# Patient Record
Sex: Female | Born: 1985 | Race: White | Hispanic: No | Marital: Single | Smoking: Former smoker
Health system: Southern US, Community
[De-identification: ages and names within clinical notes are randomized; demographics above are authoritative.]

## PROBLEM LIST (undated history)

## (undated) DIAGNOSIS — J189 Pneumonia, unspecified organism: Secondary | ICD-10-CM

## (undated) HISTORY — PX: HERNIA REPAIR: SHX51

## (undated) HISTORY — PX: IUD REMOVAL: SHX5392

---

## 2018-08-23 ENCOUNTER — Encounter: Payer: Self-pay | Admitting: Emergency Medicine

## 2018-08-23 ENCOUNTER — Other Ambulatory Visit: Payer: Self-pay

## 2018-08-23 ENCOUNTER — Emergency Department (INDEPENDENT_AMBULATORY_CARE_PROVIDER_SITE_OTHER)
Admission: EM | Admit: 2018-08-23 | Discharge: 2018-08-23 | Disposition: A | Discharge status: Home / Self Care | Payer: Medicaid Other | Source: Home / Self Care

## 2018-08-23 DIAGNOSIS — N3001 Acute cystitis with hematuria: Secondary | ICD-10-CM

## 2018-08-23 LAB — POCT URINALYSIS DIP (MANUAL ENTRY)
Bilirubin, UA: NEGATIVE
Glucose, UA: NEGATIVE mg/dL
Nitrite, UA: NEGATIVE
Protein Ur, POC: 30 mg/dL — AB
Spec Grav, UA: >=1.03 — AB (ref 1.010–1.025)
Urobilinogen, UA: 0.2 E.U./dL
pH, UA: 6.5 (ref 5.0–8.0)

## 2018-08-23 LAB — POCT URINE PREGNANCY: Preg Test, Ur: NEGATIVE

## 2018-08-23 MED ORDER — CEPHALEXIN 500 MG PO CAPS: 500.0000 mg | ORAL_CAPSULE | Freq: Two times a day (BID) | ORAL | 0 refills | Status: DC

## 2018-08-23 NOTE — ED Triage Notes (Signed)
Dysuria x 2 days and pregnancy test

## 2018-08-23 NOTE — Discharge Instructions (Signed)
°  Please take your antibiotic as prescribed. A urine culture has been sent to check the severity of your urinary infection and to determine if you are on the most appropriate antibiotic. The results should come back within 2-3 days and you will be notified even if no medication change is needed. ° °Please stay well hydrated and follow up with your family doctor in 1 week if not improving, sooner if worsening. ° °You may try over the counter medication called Azo to help with bladder spasms.  This medication can make your urine orange, which is normal.  ° °

## 2018-08-23 NOTE — ED Provider Notes (Signed)
[facility]    CSN: [id number] Arrival date & time: 08/23/18  1534     History   Chief Complaint Chief Complaint  Patient presents with  . Dysuria    HPI Ellen Romero is a 33 y.o. female.   HPI  Ellen Romero is a 33 y.o. female presenting to UC with c/o 2 days of worsening pain with urination and decreased urination when she goes.  Mild bladder pressure, and Left lower back pain.  Denies fever, chills, n/v/d. Pt reports having her IUD removed recently and being more sexually active recently. LMP 07/31/2018. Pt requesting a pregnancy test. Denies concern for STIs.   History reviewed. No pertinent past medical history.  There are no active problems to display for this patient.   History reviewed. No pertinent surgical history.  OB History   No obstetric history on file.      Home Medications    Prior to Admission medications   Medication Sig Start Date End Date Taking? Authorizing Provider  fexofenadine (ALLEGRA) 180 MG tablet Take 180 mg by mouth daily.   Yes [provider]  cephALEXin (KEFLEX) 500 MG capsule Take 1 capsule (500 mg total) by mouth 2 (two) times daily. 08/23/18   Lurene ShadowPhelps, Izayah Miner O, PA-C    Family History Family History  Problem Relation Age of Onset  . Hypertension Mother   . Atrial fibrillation Father     Social History Social History   Tobacco Use  . Smoking status: Never Smoker  . Smokeless tobacco: Never Used  Substance Use Topics  . Alcohol use: Yes  . Drug use: Not Currently     Allergies   Amoxicillin   Review of Systems Review of Systems  Constitutional: Negative for chills and fever.  Gastrointestinal: Positive for abdominal pain. Negative for diarrhea, nausea and vomiting.  Genitourinary: Positive for decreased urine volume, dysuria, frequency and urgency. Negative for flank pain.  Musculoskeletal: Positive for back pain. Negative for myalgias.     Physical Exam Triage Vital Signs ED Triage  Vitals  Enc Vitals Group     BP 08/23/18 1604 116/78     Pulse Rate 08/23/18 1604 75     Resp --      Temp 08/23/18 1604 98.4 F (36.9 C)     Temp Source 08/23/18 1604 Oral     SpO2 08/23/18 1604 98 %     Weight 08/23/18 1605 153 lb (69.4 kg)     Height 08/23/18 1605 5\' 4"  (1.626 m)     Head Circumference --      Peak Flow --      Pain Score 08/23/18 1605 4     Pain Loc --      Pain Edu? --      Excl. in [place]? --    No data found.  Updated Vital Signs BP 116/78 (BP Location: Right Arm)   Pulse 75   Temp 98.4 F (36.9 C) (Oral)   Ht 5\' 4"  (1.626 m)   Wt 153 lb (69.4 kg)   LMP 07/31/2018 (Approximate)   SpO2 98%   BMI 26.26 kg/m   Visual Acuity Right Eye Distance:   Left Eye Distance:   Bilateral Distance:    Right Eye Near:   Left Eye Near:    Bilateral Near:     Physical Exam Vitals signs and nursing note reviewed.  Constitutional:      Appearance: Normal appearance. She is well-developed.  HENT:     Head: Normocephalic  and atraumatic.  Neck:     Musculoskeletal: Normal range of motion.  Cardiovascular:     Rate and Rhythm: Normal rate and regular rhythm.  Pulmonary:     Effort: Pulmonary effort is normal.     Breath sounds: Normal breath sounds.  Abdominal:     Palpations: Abdomen is soft.     Tenderness: There is abdominal tenderness in the suprapubic area. There is no right CVA tenderness or left CVA tenderness.  Musculoskeletal: Normal range of motion.  Skin:    General: Skin is warm and dry.  Neurological:     Mental Status: She is alert and oriented to person, place, and time.  Psychiatric:        Behavior: Behavior normal.      UC Treatments / Results  Labs (all labs ordered are listed, but only abnormal results are displayed) Labs Reviewed  POCT URINALYSIS DIP (MANUAL ENTRY) - Abnormal; Notable for the following components:      Result Value   Clarity, UA cloudy (*)    Ketones, POC UA trace (5) (*)    Spec Grav, UA >=1.030 (*)     Blood, UA large (*)    Protein Ur, POC =30 (*)    Leukocytes, UA Small (1+) (*)    All other components within normal limits  URINE CULTURE  POCT URINE PREGNANCY    EKG   Radiology No results found.  Procedures Procedures (including critical care time)  Medications Ordered in UC Medications - No data to display  Initial Impression / Assessment and Plan / UC Course  I have reviewed the triage vital signs and the nursing notes.  Pertinent labs & imaging results that were available during my care of the patient were reviewed by me and considered in my medical decision making (see chart for details).     UA c/w UTI Culture sent Will start pt on Keflex, pt believes she has had it in the past w/o reaction AVS provided.  Final Clinical Impressions(s) / UC Diagnoses   Final diagnoses:  Acute cystitis with hematuria     Discharge Instructions      Please take your antibiotic as prescribed. A urine culture has been sent to check the severity of your urinary infection and to determine if you are on the most appropriate antibiotic. The results should come back within 2-3 days and you will be notified even if no medication change is needed.  Please stay well hydrated and follow up with your family doctor in 1 week if not improving, sooner if worsening.  You may try over the counter medication called Azo to help with bladder spasms.  This medication can make your urine orange, which is normal.      ED Prescriptions    Medication Sig Dispense Auth. Provider   cephALEXin (KEFLEX) 500 MG capsule Take 1 capsule (500 mg total) by mouth 2 (two) times daily. 14 capsule Noe Gens, PA-C     Controlled Substance Prescriptions [place] Controlled Substance Registry consulted? Not Applicable   Tyrell Antonio 08/23/18 1727

## 2018-08-25 ENCOUNTER — Telehealth: Payer: Self-pay

## 2018-08-25 LAB — URINE CULTURE
MICRO NUMBER:: 674251
SPECIMEN QUALITY:: ADEQUATE

## 2018-08-25 NOTE — Telephone Encounter (Signed)
Pt states she is feeling better since starting antibiotic. Advised to increase fluids and take entire course of antibiotics. If sxs change, calll to change med.

## 2019-01-19 ENCOUNTER — Emergency Department (INDEPENDENT_AMBULATORY_CARE_PROVIDER_SITE_OTHER)
Admission: EM | Admit: 2019-01-19 | Discharge: 2019-01-19 | Disposition: A | Discharge status: Home / Self Care | Payer: Medicaid Other | Source: Home / Self Care

## 2019-01-19 ENCOUNTER — Other Ambulatory Visit: Payer: Self-pay

## 2019-01-19 ENCOUNTER — Encounter: Payer: Self-pay | Admitting: Emergency Medicine

## 2019-01-19 DIAGNOSIS — H1031 Unspecified acute conjunctivitis, right eye: Secondary | ICD-10-CM | POA: Diagnosis not present

## 2019-01-19 MED ORDER — TOBRAMYCIN 0.3 % OP SOLN: 1.0000 [drp] | OPHTHALMIC | 0 refills | Status: DC

## 2019-01-19 MED ORDER — TOBRAMYCIN 0.3 % OP SOLN: 1.0000 [drp] | OPHTHALMIC | 0 refills | Status: AC

## 2019-01-19 NOTE — Discharge Instructions (Addendum)
See your Physician for recheck if any problems.  Warm compresses 20 minutes 4 times a day

## 2019-01-19 NOTE — ED Triage Notes (Signed)
Here with right eye swelling and drainage that started yesterday; URI's sx's reported as well. Awaiting covid results; boyfriend pos 2 dys ago

## 2019-01-20 NOTE — ED Provider Notes (Signed)
[facility]    CSN: [id number] Arrival date & time: 01/19/19  1129      History   Chief Complaint Chief Complaint  Patient presents with  . Conjunctivitis    right eye swelling     HPI Ellen Romero is a 33 y.o. female.   Patient reports she was exposed to Covid she has a Covid test that is pending.  Patient complains of redness and swelling to right eyelid and drainage from right eye.  Patient denies any visual changes.  Patient has not tried any treatment.  The history is provided by the patient. No language interpreter was used.  Conjunctivitis    History reviewed. No pertinent past medical history.  There are no problems to display for this patient.   History reviewed. No pertinent surgical history.  OB History   No obstetric history on file.      Home Medications    Prior to Admission medications   Medication Sig Start Date End Date Taking? Authorizing Provider  cephALEXin (KEFLEX) 500 MG capsule Take 1 capsule (500 mg total) by mouth 2 (two) times daily. 08/23/18   Noe Gens, PA-C  fexofenadine (ALLEGRA) 180 MG tablet Take 180 mg by mouth daily.    [provider]  tobramycin (TOBREX) 0.3 % ophthalmic solution Place 1 drop into the left eye every 4 (four) hours for 10 days. 01/19/19 01/29/19  Fransico Meadow, PA-C    Family History Family History  Problem Relation Age of Onset  . Hypertension Mother   . Atrial fibrillation Father     Social History Social History   Tobacco Use  . Smoking status: Never Smoker  . Smokeless tobacco: Never Used  Substance Use Topics  . Alcohol use: Yes  . Drug use: Not Currently     Allergies   Amoxicillin   Review of Systems Review of Systems  All other systems reviewed and are negative.    Physical Exam Triage Vital Signs ED Triage Vitals  Enc Vitals Group     BP 01/19/19 1209 110/76     Pulse Rate 01/19/19 1209 (!) 57     Resp --      Temp 01/19/19 1209 97.7 F (36.5  C)     Temp Source 01/19/19 1209 Oral     SpO2 01/19/19 1209 99 %     Weight 01/19/19 1210 150 lb 12.8 oz (68.4 kg)     Height 01/19/19 1210 5\' 4"  (1.626 m)     Head Circumference --      Peak Flow --      Pain Score 01/19/19 1210 5     Pain Loc --      Pain Edu? --      Excl. in [place]? --    No data found.  Updated Vital Signs BP 110/76 (BP Location: Left Arm)   Pulse (!) 57   Temp 97.7 F (36.5 C) (Oral)   Ht 5\' 4"  (1.626 m)   Wt 68.4 kg   SpO2 99%   BMI 25.88 kg/m   Visual Acuity Right Eye Distance:   Left Eye Distance:   Bilateral Distance:    Right Eye Near:   Left Eye Near:    Bilateral Near:     Physical Exam Vitals and nursing note reviewed.  Constitutional:      Appearance: She is well-developed.  HENT:     Head: Normocephalic.  Eyes:     General:  Right eye: Discharge present.     Extraocular Movements: Extraocular movements intact.     Pupils: Pupils are equal, round, and reactive to light.     Comments: Swollen right eyelid  Cardiovascular:     Rate and Rhythm: Normal rate.  Pulmonary:     Effort: Pulmonary effort is normal.  Musculoskeletal:        General: Normal range of motion.  Neurological:     Mental Status: She is alert and oriented to person, place, and time.      UC Treatments / Results  Labs (all labs ordered are listed, but only abnormal results are displayed) Labs Reviewed - No data to display  EKG   Radiology No results found.  Procedures Procedures (including critical care time)  Medications Ordered in UC Medications - No data to display  Initial Impression / Assessment and Plan / UC Course  I have reviewed the triage vital signs and the nursing notes.  Pertinent labs & imaging results that were available during my care of the patient were reviewed by me and considered in my medical decision making (see chart for details).     MDM: Symptoms may be secondary to Covid infection however patient also may just  have a conjunctivitis patient is given a prescription for Tobrex ophthalmic solution.  She is advised warm compresses 20 minutes 4 times a day.  Recheck in 48 hours if symptoms or not improving Final Clinical Impressions(s) / UC Diagnoses   Final diagnoses:  Acute conjunctivitis of right eye, unspecified acute conjunctivitis type     Discharge Instructions     See your Physician for recheck if any problems.  Warm compresses 20 minutes 4 times a day   ED Prescriptions    Medication Sig Dispense Auth. Provider   tobramycin (TOBREX) 0.3 % ophthalmic solution  (Status: Discontinued) Place 1 drop into the left eye every 4 (four) hours for 10 days. 5 mL Sofia, Leslie K, PA-C   tobramycin (TOBREX) 0.3 % ophthalmic solution Place 1 drop into the left eye every 4 (four) hours for 10 days. 5 mL Elson Areas, [place]     PDMP not reviewed this encounter.  An After Visit Summary was printed and given to the patient.   Elson Areas, [place] 01/20/19 4097

## 2020-06-25 ENCOUNTER — Encounter (HOSPITAL_BASED_OUTPATIENT_CLINIC_OR_DEPARTMENT_OTHER): Payer: Self-pay | Admitting: *Deleted

## 2020-06-25 ENCOUNTER — Other Ambulatory Visit: Payer: Self-pay

## 2020-06-25 ENCOUNTER — Emergency Department (HOSPITAL_BASED_OUTPATIENT_CLINIC_OR_DEPARTMENT_OTHER)
Admission: EM | Admit: 2020-06-25 | Discharge: 2020-06-26 | Disposition: A | Discharge status: Home / Self Care | Payer: Medicaid Other | Attending: Emergency Medicine | Admitting: Emergency Medicine

## 2020-06-25 DIAGNOSIS — S81852A Open bite, left lower leg, initial encounter: Secondary | ICD-10-CM | POA: Insufficient documentation

## 2020-06-25 DIAGNOSIS — S8992XA Unspecified injury of left lower leg, initial encounter: Secondary | ICD-10-CM | POA: Diagnosis present

## 2020-06-25 DIAGNOSIS — S61052A Open bite of left thumb without damage to nail, initial encounter: Secondary | ICD-10-CM | POA: Insufficient documentation

## 2020-06-25 DIAGNOSIS — S91052A Open bite, left ankle, initial encounter: Secondary | ICD-10-CM | POA: Diagnosis not present

## 2020-06-25 DIAGNOSIS — W540XXA Bitten by dog, initial encounter: Secondary | ICD-10-CM | POA: Diagnosis not present

## 2020-06-25 NOTE — ED Triage Notes (Signed)
c/o animal bite to left thumb , right buttocks and left ankle x 3 hrs ago

## 2020-06-26 ENCOUNTER — Emergency Department (HOSPITAL_BASED_OUTPATIENT_CLINIC_OR_DEPARTMENT_OTHER): Payer: Medicaid Other

## 2020-06-26 MED ORDER — BACITRACIN ZINC 500 UNIT/GM EX OINT
TOPICAL_OINTMENT | Freq: Two times a day (BID) | CUTANEOUS | Status: DC
  Administered 2020-06-26: 1 via TOPICAL
  Filled 2020-06-26: qty 28.35

## 2020-06-26 MED ORDER — CLINDAMYCIN HCL 150 MG PO CAPS: 300.0000 mg | ORAL_CAPSULE | Freq: Three times a day (TID) | ORAL | 0 refills | Status: AC

## 2020-06-26 MED ORDER — SULFAMETHOXAZOLE-TRIMETHOPRIM 800-160 MG PO TABS: 1.0000 | ORAL_TABLET | Freq: Two times a day (BID) | ORAL | 0 refills | Status: AC

## 2020-06-26 MED ORDER — LIDOCAINE HCL (PF) 1 % IJ SOLN
5.0000 mL | Freq: Once | INTRAMUSCULAR | Status: AC
  Administered 2020-06-26: 5 mL
  Filled 2020-06-26: qty 5

## 2020-06-26 NOTE — ED Provider Notes (Signed)
[facility] EMERGENCY DEPARTMENT Provider Note   CSN: [id number] Arrival date & time: 06/25/20  2257     History Chief Complaint  Patient presents with  . Animal Bite    Ellen Romero is a 35 y.o. female.  Patient presents chief complaint of dog bite to her left lower extremity.  She was at home when her father's dog which is a Rottweiler husky mix charged at her and bit her.  Patient states that she had to fight her off.  She was bitten the left hand thumb as well as the left ankle.  She denies any fevers vomiting cough or diarrhea.  Denies pain elsewhere.        History reviewed. No pertinent past medical history.  There are no problems to display for this patient.   Past Surgical History:  Procedure Laterality Date  . HERNIA REPAIR       OB History   No obstetric history on file.     Family History  Problem Relation Age of Onset  . Hypertension Mother   . Atrial fibrillation Father     Social History   Tobacco Use  . Smoking status: Never Smoker  . Smokeless tobacco: Never Used  Vaping Use  . Vaping Use: Never used  Substance Use Topics  . Alcohol use: Yes  . Drug use: Not Currently    Home Medications Prior to Admission medications   Medication Sig Start Date End Date Taking? Authorizing Provider  clindamycin (CLEOCIN) 150 MG capsule Take 2 capsules (300 mg total) by mouth 3 (three) times daily for 7 days. 06/26/20 07/03/20 Yes Cheryll Cockayne, MD  sulfamethoxazole-trimethoprim (BACTRIM DS) 800-160 MG tablet Take 1 tablet by mouth 2 (two) times daily for 7 days. 06/26/20 07/03/20 Yes Cheryll Cockayne, MD  fexofenadine (ALLEGRA) 180 MG tablet Take 180 mg by mouth daily.    [provider]    Allergies    Amoxicillin  Review of Systems   Review of Systems  Constitutional: Negative for fever.  HENT: Negative for ear pain.   Eyes: Negative for pain.  Respiratory: Negative for cough.   Cardiovascular: Negative for chest pain.   Gastrointestinal: Negative for abdominal pain.  Genitourinary: Negative for flank pain.  Musculoskeletal: Negative for back pain.  Skin: Negative for rash.  Neurological: Negative for headaches.    Physical Exam Updated Vital Signs BP 121/89 (BP Location: Left Arm)   Pulse 67   Temp 98.4 F (36.9 C) (Oral)   Resp 17   Ht 5\' 4"  (1.626 m)   Wt 72.1 kg   SpO2 99%   BMI 27.29 kg/m   Physical Exam Constitutional:      General: She is not in acute distress.    Appearance: Normal appearance.  HENT:     Head: Normocephalic.     Nose: Nose normal.  Eyes:     Extraocular Movements: Extraocular movements intact.  Cardiovascular:     Rate and Rhythm: Normal rate.  Pulmonary:     Effort: Pulmonary effort is normal.  Musculoskeletal:        General: Normal range of motion.     Cervical back: Normal range of motion.  Skin:    Comments: For laceration seen in the left ankle on each side.  Total approximate length 4 cm.  0.5 cm punctate laceration seen in the left hand thumb region.  Neurological:     General: No focal deficit present.     Mental Status: She is  alert. Mental status is at baseline.     ED Results / Procedures / Treatments   Labs (all labs ordered are listed, but only abnormal results are displayed) Labs Reviewed - No data to display  EKG None  Radiology DG Ankle 2 Views Left  Result Date: 06/26/2020 CLINICAL DATA:  Left ankle dog bite EXAM: LEFT ANKLE - 2 VIEW COMPARISON:  None. FINDINGS: Two view radiograph left ankle demonstrates normal alignment. No fracture or dislocation. No ankle effusion. Subcutaneous gas is seen just proximal to the medial malleolus and there is mild bimalleolar soft tissue swelling in keeping with the given history of soft tissue injury. No retained radiopaque foreign body. IMPRESSION: Soft tissue swelling and punctate subcutaneous gas. No acute fracture or dislocation. Electronically Signed   By: Helyn Numbers MD   On: 06/26/2020  01:37    Procedures .Marland KitchenLaceration Repair  Date/Time: 06/26/2020 4:13 AM Performed by: Cheryll Cockayne, MD Authorized by: Cheryll Cockayne, MD   Consent:    Risks discussed:  Infection, pain, need for additional repair, retained foreign body and poor cosmetic result Comments:     Four 5 days 0 Ethilon sutures placed on left lower extremity.  Wound edges approximated loosely given they are from a dog bite to allow drainage.  Irrigated with sterile saline prior to closure.  No foreign body noted.     Medications Ordered in ED Medications  bacitracin ointment (1 application Topical Given 06/26/20 0307)  lidocaine (PF) (XYLOCAINE) 1 % injection 5 mL (5 mLs Infiltration Given 06/26/20 0101)    ED Course  I have reviewed the triage vital signs and the nursing notes.  Pertinent labs & imaging results that were available during my care of the patient were reviewed by me and considered in my medical decision making (see chart for details).    MDM Rules/Calculators/A&P                          Laceration wounds were thoroughly irrigated with sterile water.  Risks and benefits of closure discussed with patient given animal bite and possible infection.  Patient opted for closure as opposed to leaving the wounds gaping.  X-ray is unremarkable.  Wounds loosely closed with 5-0 Ethilon sutures.  Discussed with patient risk of infection and need for close management and follow-up.  Patient given antibiotics prophylactically for dog bites.   Final Clinical Impression(s) / ED Diagnoses Final diagnoses:  Dog bite, initial encounter    Rx / DC Orders ED Discharge Orders         Ordered    clindamycin (CLEOCIN) 150 MG capsule  3 times daily        06/26/20 0243    sulfamethoxazole-trimethoprim (BACTRIM DS) 800-160 MG tablet  2 times daily        06/26/20 0243           Cheryll Cockayne, MD 06/26/20 [phone]

## 2020-06-26 NOTE — ED Notes (Signed)
ED Provider at bedside suturing wounds

## 2020-06-26 NOTE — ED Notes (Signed)
Wounds to left ankle irrigated with sterile water  Tolerated well

## 2020-06-26 NOTE — Discharge Instructions (Signed)
Keep your wound dry for 48 hours.  You may wash gently with soap and water afterwards avoid prolonged soaking. Keep the wound covered for the first week. Return immediately if you have increased redness increased pain purulent discharge or any additional concerns.  Return in 7 to 10 days for suture removal.

## 2020-08-31 ENCOUNTER — Other Ambulatory Visit: Payer: Self-pay

## 2020-08-31 ENCOUNTER — Emergency Department (INDEPENDENT_AMBULATORY_CARE_PROVIDER_SITE_OTHER)
Admission: EM | Admit: 2020-08-31 | Discharge: 2020-08-31 | Disposition: A | Discharge status: Home / Self Care | Payer: Medicaid Other | Source: Home / Self Care

## 2020-08-31 DIAGNOSIS — U071 COVID-19: Secondary | ICD-10-CM | POA: Diagnosis not present

## 2020-08-31 DIAGNOSIS — R6889 Other general symptoms and signs: Secondary | ICD-10-CM

## 2020-08-31 LAB — POC SARS CORONAVIRUS 2 AG -  ED: SARS Coronavirus 2 Ag: POSITIVE — AB

## 2020-08-31 NOTE — ED Provider Notes (Signed)
[facility]    CSN: [id number] Arrival date & time: 08/31/20  1632      History   Chief Complaint Chief Complaint  Patient presents with   Sore Throat   Headache   Chills   Generalized Body Aches    HPI Ellen Romero is a 35 y.o. female.   HPI 35 year old female presents with chills, sore throat, body aches, headache that began last night.  Reports taking OTC ibuprofen for her current symptoms.  History reviewed. No pertinent past medical history.  There are no problems to display for this patient.   Past Surgical History:  Procedure Laterality Date   HERNIA REPAIR      OB History   No obstetric history on file.      Home Medications    Prior to Admission medications   Medication Sig Start Date End Date Taking? Authorizing Provider  ibuprofen (ADVIL) 200 MG tablet Take 200 mg by mouth every 6 (six) hours as needed.   Yes [provider]  fexofenadine (ALLEGRA) 180 MG tablet Take 180 mg by mouth daily. Patient not taking: Reported on 08/31/2020    [provider]    Family History Family History  Problem Relation Age of Onset   Hypertension Mother    Atrial fibrillation Father     Social History Social History   Tobacco Use   Smoking status: Never   Smokeless tobacco: Never  Vaping Use   Vaping Use: Never used  Substance Use Topics   Alcohol use: Yes   Drug use: Not Currently     Allergies   Amoxicillin   Review of Systems Review of Systems  Constitutional:  Positive for chills and fever.  HENT:  Positive for sore throat.   Musculoskeletal:        Myalgias since yesterday  All other systems reviewed and are negative.   Physical Exam Triage Vital Signs ED Triage Vitals  Enc Vitals Group     BP 08/31/20 1707 126/81     Pulse Rate 08/31/20 1707 95     Resp 08/31/20 1707 14     Temp 08/31/20 1707 99.9 F (37.7 C)     Temp Source 08/31/20 1707 Oral     SpO2 08/31/20 1707 98 %     Weight 08/31/20 1708  154 lb (69.9 kg)     Height 08/31/20 1708 5\' 4"  (1.626 m)     Head Circumference --      Peak Flow --      Pain Score 08/31/20 1708 0     Pain Loc --      Pain Edu? --      Excl. in [place]? --    No data found.  Updated Vital Signs BP 126/81 (BP Location: Right Arm)   Pulse 95   Temp 99.9 F (37.7 C) (Oral)   Resp 14   Ht 5\' 4"  (1.626 m)   Wt 154 lb (69.9 kg)   SpO2 98%   BMI 26.43 kg/m    Physical Exam Vitals and nursing note reviewed.  Constitutional:      General: She is not in acute distress.    Appearance: Normal appearance. She is normal weight. She is ill-appearing.  HENT:     Head: Normocephalic and atraumatic.     Right Ear: Tympanic membrane, ear canal and external ear normal.     Left Ear: Tympanic membrane, ear canal and external ear normal.     Nose: Nose normal.  Mouth/Throat:     Mouth: Mucous membranes are moist.     Pharynx: Oropharynx is clear. Uvula midline. Posterior oropharyngeal erythema and uvula swelling present.     Comments: Amount of clear drainage of posterior oropharynx noted Eyes:     Extraocular Movements: Extraocular movements intact.     Conjunctiva/sclera: Conjunctivae normal.     Pupils: Pupils are equal, round, and reactive to light.  Cardiovascular:     Rate and Rhythm: Normal rate and regular rhythm.     Pulses: Normal pulses.     Heart sounds: Normal heart sounds.  Pulmonary:     Effort: Pulmonary effort is normal.     Breath sounds: Normal breath sounds. No stridor. No wheezing, rhonchi or rales.  Musculoskeletal:        General: Normal range of motion.     Cervical back: Normal range of motion and neck supple.  Lymphadenopathy:     Cervical: No cervical adenopathy.  Skin:    General: Skin is warm and dry.  Neurological:     General: No focal deficit present.     Mental Status: She is alert and oriented to person, place, and time.  Psychiatric:        Mood and Affect: Mood normal.        Behavior: Behavior normal.      UC Treatments / Results  Labs (all labs ordered are listed, but only abnormal results are displayed) Labs Reviewed  POC SARS CORONAVIRUS 2 AG -  ED - Abnormal; Notable for the following components:      Result Value   SARS Coronavirus 2 Ag Positive (*)    All other components within normal limits    EKG   Radiology No results found.  Procedures Procedures (including critical care time)  Medications Ordered in UC Medications - No data to display  Initial Impression / Assessment and Plan / UC Course  I have reviewed the triage vital signs and the nursing notes.  Pertinent labs & imaging results that were available during my care of the patient were reviewed by me and considered in my medical decision making (see chart for details).     MDM: 1.  Flu-like symptoms, 2.  COVID-19-advised conservative measures may alternate between Tylenol and ibuprofen and to self quarantine for 10 days or until 09/10/2020.  Patient discharged home, hemodynamically stable. Final Clinical Impressions(s) / UC Diagnoses   Final diagnoses:  Flu-like symptoms  COVID-19     Discharge Instructions      Advised patient may alternate between Tylenol 1000 mg 2-3 times daily, as needed and ibuprofen 800 mg 2-3 times daily, as needed for fever and myalgias.  Advised/encouraged patient to self quarantine for 10 days or 08/0/22.     ED Prescriptions   None    PDMP not reviewed this encounter.   Trevor Iha, FNP 08/31/20 [phone]

## 2020-08-31 NOTE — ED Triage Notes (Signed)
Pt seen in UC w/ c/o chills, sore throat, body aches, headache that began last night. Pt states she took ibuprofen for sx.

## 2020-08-31 NOTE — Discharge Instructions (Addendum)
Advised patient may alternate between Tylenol 1000 mg 2-3 times daily, as needed and ibuprofen 800 mg 2-3 times daily, as needed for fever and myalgias.  Advised/encouraged patient to self quarantine for 10 days or until 09/10/20.

## 2020-11-13 ENCOUNTER — Emergency Department (INDEPENDENT_AMBULATORY_CARE_PROVIDER_SITE_OTHER)
Admission: EM | Admit: 2020-11-13 | Discharge: 2020-11-13 | Disposition: A | Discharge status: Home / Self Care | Payer: Medicaid Other | Source: Home / Self Care

## 2020-11-13 ENCOUNTER — Other Ambulatory Visit: Payer: Self-pay

## 2020-11-13 DIAGNOSIS — R300 Dysuria: Secondary | ICD-10-CM

## 2020-11-13 LAB — POCT URINALYSIS DIP (MANUAL ENTRY)
Bilirubin, UA: NEGATIVE
Glucose, UA: NEGATIVE mg/dL
Ketones, POC UA: NEGATIVE mg/dL
Nitrite, UA: NEGATIVE
Protein Ur, POC: NEGATIVE mg/dL
Spec Grav, UA: 1.015 (ref 1.010–1.025)
Urobilinogen, UA: 0.2 E.U./dL
pH, UA: 7 (ref 5.0–8.0)

## 2020-11-13 MED ORDER — SULFAMETHOXAZOLE-TRIMETHOPRIM 800-160 MG PO TABS: 1.0000 | ORAL_TABLET | Freq: Two times a day (BID) | ORAL | 0 refills | Status: AC

## 2020-11-13 NOTE — ED Provider Notes (Signed)
[name]    CSN: [id number] Arrival date & time: 11/13/20  1129      History   Chief Complaint Chief Complaint  Patient presents with   urinary odor    HPI Ellen Romero is a 35 y.o. female.   HPI 35 year old female presents with urinary odor, spotting, and dysuria that has now resolved.  Patient reports her symptoms began 1.5 weeks ago after having sex for 3 days in a row.  History reviewed. No pertinent past medical history.  There are no problems to display for this patient.   Past Surgical History:  Procedure Laterality Date   HERNIA REPAIR      OB History   No obstetric history on file.      Home Medications    Prior to Admission medications   Medication Sig Start Date End Date Taking? Authorizing Provider  sulfamethoxazole-trimethoprim (BACTRIM DS) 800-160 MG tablet Take 1 tablet by mouth 2 (two) times daily for 3 days. 11/13/20 11/16/20 Yes Trevor Iha, FNP  fexofenadine (ALLEGRA) 180 MG tablet Take 180 mg by mouth daily. Patient not taking: Reported on 08/31/2020    [provider]  ibuprofen (ADVIL) 200 MG tablet Take 200 mg by mouth every 6 (six) hours as needed. Patient not taking: Reported on 11/13/2020    [provider]    Family History Family History  Problem Relation Age of Onset   Hypertension Mother    Atrial fibrillation Father     Social History Social History   Tobacco Use   Smoking status: Never   Smokeless tobacco: Never  Vaping Use   Vaping Use: Never used  Substance Use Topics   Alcohol use: Yes   Drug use: Not Currently     Allergies   Amoxicillin   Review of Systems Review of Systems  Genitourinary:  Positive for dysuria and urgency.    Physical Exam Triage Vital Signs ED Triage Vitals  Enc Vitals Group     BP 11/13/20 1143 112/75     Pulse Rate 11/13/20 1143 66     Resp 11/13/20 1143 14     Temp 11/13/20 1143 97.9 F (36.6 C)     Temp Source 11/13/20 1143 Oral     SpO2  11/13/20 1143 98 %     Weight --      Height --      Head Circumference --      Peak Flow --      Pain Score 11/13/20 1144 0     Pain Loc --      Pain Edu? --      Excl. in GC? --    No data found.  Updated Vital Signs BP 112/75 (BP Location: Right Arm)   Pulse 66   Temp 97.9 F (36.6 C) (Oral)   Resp 14   SpO2 98%     Physical Exam Vitals and nursing note reviewed.  Constitutional:      General: She is not in acute distress.    Appearance: Normal appearance. She is normal weight. She is not ill-appearing.  HENT:     Head: Normocephalic and atraumatic.     Mouth/Throat:     Mouth: Mucous membranes are moist.     Pharynx: Oropharynx is clear.  Eyes:     Extraocular Movements: Extraocular movements intact.     Conjunctiva/sclera: Conjunctivae normal.     Pupils: Pupils are equal, round, and reactive to light.  Cardiovascular:     Rate and  Rhythm: Normal rate and regular rhythm.     Pulses: Normal pulses.     Heart sounds: Normal heart sounds.  Pulmonary:     Effort: Pulmonary effort is normal.     Breath sounds: Normal breath sounds.  Abdominal:     Tenderness: There is no right CVA tenderness or left CVA tenderness.  Musculoskeletal:        General: Normal range of motion.     Cervical back: Normal range of motion and neck supple.  Skin:    General: Skin is warm and dry.  Neurological:     General: No focal deficit present.     Mental Status: She is alert and oriented to person, place, and time. Mental status is at baseline.  Psychiatric:        Mood and Affect: Mood normal.        Behavior: Behavior normal.     UC Treatments / Results  Labs (all labs ordered are listed, but only abnormal results are displayed) Labs Reviewed  POCT URINALYSIS DIP (MANUAL ENTRY) - Abnormal; Notable for the following components:      Result Value   Color, UA light yellow (*)    Clarity, UA cloudy (*)    Blood, UA trace-intact (*)    Leukocytes, UA Small (1+) (*)    All  other components within normal limits  URINE CULTURE    EKG   Radiology No results found.  Procedures Procedures (including critical care time)  Medications Ordered in UC Medications - No data to display  Initial Impression / Assessment and Plan / UC Course  I have reviewed the triage vital signs and the nursing notes.  Pertinent labs & imaging results that were available during my care of the patient were reviewed by me and considered in my medical decision making (see chart for details).     MDM: 1. Dysuria-Rx'd Bactrim. Advised patient to take medication as directed with food to completion.  Advised patient we will follow-up with urine culture results once received.  Encouraged patient to increase daily water intake while taking this medication.  Final Clinical Impressions(s) / UC Diagnoses   Final diagnoses:  Dysuria     Discharge Instructions      Advised patient to take medication as directed with food to completion.  Advised patient we will follow-up with urine culture results once received.  Encouraged patient to increase daily water intake while taking this medication.     ED Prescriptions     Medication Sig Dispense Auth. Provider   sulfamethoxazole-trimethoprim (BACTRIM DS) 800-160 MG tablet Take 1 tablet by mouth 2 (two) times daily for 3 days. 6 tablet Trevor Iha, FNP      PDMP not reviewed this encounter.   Trevor Iha, FNP 11/13/20 1223

## 2020-11-13 NOTE — ED Triage Notes (Signed)
Pt presents with c/o urinary odor, spotting, and dysuria that has resolved. Pt states her sx began 1.5 weeks ago after having sex three days in a row.

## 2020-11-13 NOTE — Discharge Instructions (Addendum)
Advised patient to take medication as directed with food to completion.  Advised patient we will follow-up with urine culture results once received.  Encouraged patient to increase daily water intake while taking this medication. °

## 2020-11-16 LAB — URINE CULTURE
MICRO NUMBER:: 12475493
SPECIMEN QUALITY:: ADEQUATE

## 2021-07-10 ENCOUNTER — Emergency Department (INDEPENDENT_AMBULATORY_CARE_PROVIDER_SITE_OTHER): Payer: 59

## 2021-07-10 ENCOUNTER — Emergency Department (INDEPENDENT_AMBULATORY_CARE_PROVIDER_SITE_OTHER)
Admission: EM | Admit: 2021-07-10 | Discharge: 2021-07-10 | Disposition: A | Discharge status: Home / Self Care | Payer: Medicaid Other | Source: Home / Self Care

## 2021-07-10 ENCOUNTER — Other Ambulatory Visit: Payer: Self-pay

## 2021-07-10 DIAGNOSIS — M79601 Pain in right arm: Secondary | ICD-10-CM | POA: Diagnosis not present

## 2021-07-10 DIAGNOSIS — S50811A Abrasion of right forearm, initial encounter: Secondary | ICD-10-CM | POA: Diagnosis not present

## 2021-07-10 DIAGNOSIS — L03113 Cellulitis of right upper limb: Secondary | ICD-10-CM

## 2021-07-10 MED ORDER — SULFAMETHOXAZOLE-TRIMETHOPRIM 800-160 MG PO TABS: 1.0000 | ORAL_TABLET | Freq: Two times a day (BID) | ORAL | 0 refills | Status: AC

## 2021-07-10 NOTE — Discharge Instructions (Addendum)
Advised/informed patient of right lower arm x-ray results. Advised may RICE affected area of right forearm for 25 minutes 3 times daily for 3 days advised may use OTC Ibuprofen 400 to 600 mg 1-2 times daily for right forearm pain/swelling as well.  Advised patient to take Bactrim as directed with food to completion.  Encouraged patient to increase daily water intake while taking this medication.  Advised patient if symptoms worsen and/or unresolved please follow-up with PCP or here for further evaluation.

## 2021-07-10 NOTE — ED Provider Notes (Signed)
[facility]    CSN: [id number] Arrival date & time: 07/10/21  0909      History   Chief Complaint Chief Complaint  Patient presents with   Arm Injury   Arm Pain    HPI Ellen Romero is a 36 y.o. female.   HPI 36-year-old female presents with right forearm pain following the incident 3 days ago while climbing a tree. Reports limited range of motion and notable swelling to right forearm.  History reviewed. No pertinent past medical history.  There are no problems to display for this patient.   Past Surgical History:  Procedure Laterality Date   HERNIA REPAIR     IUD REMOVAL      OB History   No obstetric history on file.      Home Medications    Prior to Admission medications   Medication Sig Start Date End Date Taking? Authorizing Provider  sulfamethoxazole-trimethoprim (BACTRIM DS) 800-160 MG tablet Take 1 tablet by mouth 2 (two) times daily for 10 days. 07/10/21 07/20/21 Yes Trevor Ihaagan, Avonna Iribe, FNP  fexofenadine (ALLEGRA) 180 MG tablet Take 180 mg by mouth daily.    [provider]  ibuprofen (ADVIL) 200 MG tablet Take 200 mg by mouth every 6 (six) hours as needed. Patient not taking: Reported on 11/13/2020    [provider]    Family History Family History  Problem Relation Age of Onset   Cancer Mother    Hypertension Mother    Atrial fibrillation Father    Diverticulitis Father     Social History Social History   Tobacco Use   Smoking status: Former    Types: Cigars    Quit date: 2019    Years since quitting: 4.4   Smokeless tobacco: Never  Vaping Use   Vaping Use: Never used  Substance Use Topics   Alcohol use: Yes    Comment: occasionally   Drug use: Not Currently     Allergies   Amoxicillin   Review of Systems Review of Systems  Musculoskeletal:        Right forearm pain x3 days  All other systems reviewed and are negative.   Physical Exam Triage Vital Signs ED Triage Vitals  Enc Vitals Group      BP 07/10/21 0940 125/87     Pulse Rate 07/10/21 0940 74     Resp 07/10/21 0940 20     Temp 07/10/21 0940 98 F (36.7 C)     Temp Source 07/10/21 0940 Oral     SpO2 07/10/21 0940 99 %     Weight 07/10/21 0936 166 lb (75.3 kg)     Height 07/10/21 0936 5\' 4"  (1.626 m)     Head Circumference --      Peak Flow --      Pain Score 07/10/21 0935 3     Pain Loc --      Pain Edu? --      Excl. in GC? --    No data found.  Updated Vital Signs BP 125/87   Pulse 74   Temp 98 F (36.7 C) (Oral)   Resp 20   Ht 5\' 4"  (1.626 m)   Wt 166 lb (75.3 kg)   LMP  (LMP Unknown)   SpO2 99%   BMI 28.49 kg/m      Physical Exam Vitals and nursing note reviewed.  Constitutional:      General: She is not in acute distress.    Appearance: Normal appearance. She  is normal weight. She is not ill-appearing.  HENT:     Head: Normocephalic and atraumatic.     Mouth/Throat:     Mouth: Mucous membranes are moist.     Pharynx: Oropharynx is clear.  Eyes:     Extraocular Movements: Extraocular movements intact.     Conjunctiva/sclera: Conjunctivae normal.     Pupils: Pupils are equal, round, and reactive to light.  Cardiovascular:     Rate and Rhythm: Normal rate and regular rhythm.     Pulses: Normal pulses.     Heart sounds: Normal heart sounds.  Pulmonary:     Effort: Pulmonary effort is normal.     Breath sounds: Normal breath sounds. No wheezing, rhonchi or rales.  Musculoskeletal:     Cervical back: Normal range of motion and neck supple.     Comments: Right forearm (superior volar aspect): 18.0 cm x 7.0 cm oval-shaped, erythematous skin avulsion, with mild to moderate soft tissue swelling and indurated borders noted, no discharge, no drainage, no lymphatic streaking  Skin:    General: Skin is warm and dry.  Neurological:     General: No focal deficit present.     Mental Status: She is alert and oriented to person, place, and time.     UC Treatments / Results  Labs (all labs  ordered are listed, but only abnormal results are displayed) Labs Reviewed - No data to display  EKG   Radiology DG Forearm Right  Result Date: 07/10/2021 CLINICAL DATA:  Right forearm injury 3 days ago. Swelling and abrasions to the forearm. EXAM: RIGHT FOREARM - 2 VIEW COMPARISON:  None Available. FINDINGS: There is no evidence of fracture or other focal bone lesions. Soft tissues are unremarkable. No evidence for a radiopaque foreign body. IMPRESSION: Negative. Electronically Signed   By: Richarda Overlie M.D.   On: 07/10/2021 09:56    Procedures Procedures (including critical care time)  Medications Ordered in UC Medications - No data to display  Initial Impression / Assessment and Plan / UC Course  I have reviewed the triage vital signs and the nursing notes.  Pertinent labs & imaging results that were available during my care of the patient were reviewed by me and considered in my medical decision making (see chart for details).     MDM: 1.  Right arm pain-right arm x-ray revealed above; 2.  Cellulitis of arm, right-Rx'd Bactrim. Advised/informed patient of right lower arm x-ray results. Advised may RICE affected area of right forearm for 25 minutes 3 times daily for 3 days advised may use OTC Ibuprofen 400 to 600 mg 1-2 times daily for right forearm pain/swelling as well.  Advised patient to take Bactrim as directed with food to completion.  Encouraged patient to increase daily water intake while taking this medication.  Advised patient if symptoms worsen and/or unresolved please follow-up with PCP or here for further evaluation.  Patient discharged home, hemodynamically stable. Final Clinical Impressions(s) / UC Diagnoses   Final diagnoses:  Right arm pain  Cellulitis of arm, right     Discharge Instructions      Advised/informed patient of right lower arm x-ray results. Advised may RICE affected area of right forearm for 25 minutes 3 times daily for 3 days advised may use OTC  Ibuprofen 400 to 600 mg 1-2 times daily for right forearm pain/swelling as well.  Advised patient to take Bactrim as directed with food to completion.  Encouraged patient to increase daily water intake while taking this  medication.  Advised patient if symptoms worsen and/or unresolved please follow-up with PCP or here for further evaluation.     ED Prescriptions     Medication Sig Dispense Auth. Provider   sulfamethoxazole-trimethoprim (BACTRIM DS) 800-160 MG tablet Take 1 tablet by mouth 2 (two) times daily for 10 days. 20 tablet Trevor Iha, FNP      PDMP not reviewed this encounter.   Trevor Iha, FNP 07/10/21 1016

## 2021-07-10 NOTE — ED Triage Notes (Signed)
Pt presents to Urgent Care with c/o R forearm pain following an incident 3 days ago while climbing a tree. Limited ROM and notable swelling and scabbing noted to R forearm.

## 2022-11-09 ENCOUNTER — Other Ambulatory Visit (HOSPITAL_COMMUNITY): Payer: Self-pay

## 2022-11-09 MED ORDER — FLULAVAL 0.5 ML IM SUSY
0.5000 mL | PREFILLED_SYRINGE | Freq: Once | INTRAMUSCULAR | 0 refills | Status: AC
  Filled 2022-11-09: qty 0.5, 1d supply, fill #0

## 2022-12-07 IMAGING — DX DG FOREARM 2V*R*
2 series · 2 of 2 positions shown · non-contrast
Comparison: None Available.

CLINICAL DATA: Right forearm injury 3 days ago. Swelling and
abrasions to the forearm.

EXAM:
RIGHT FOREARM - 2 VIEW

[forearm ap]
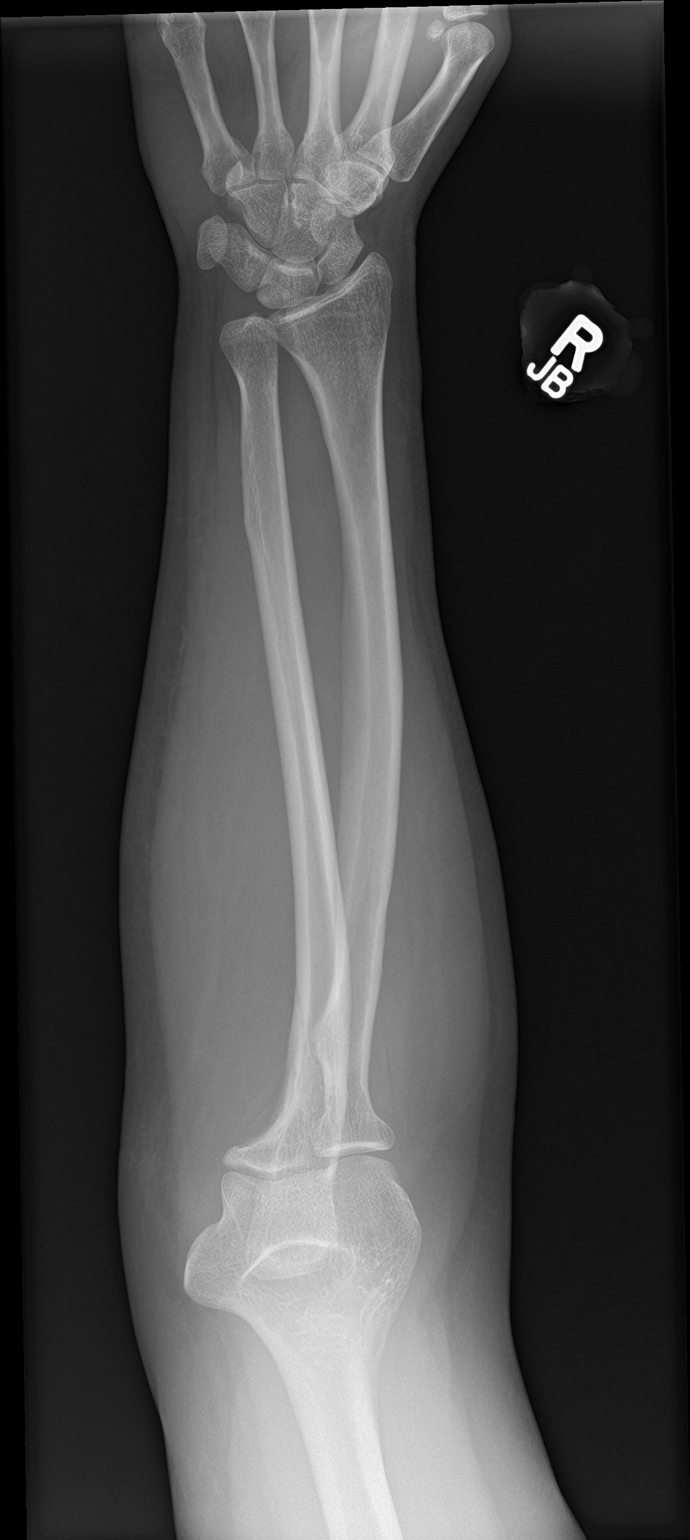

[forearm lat]
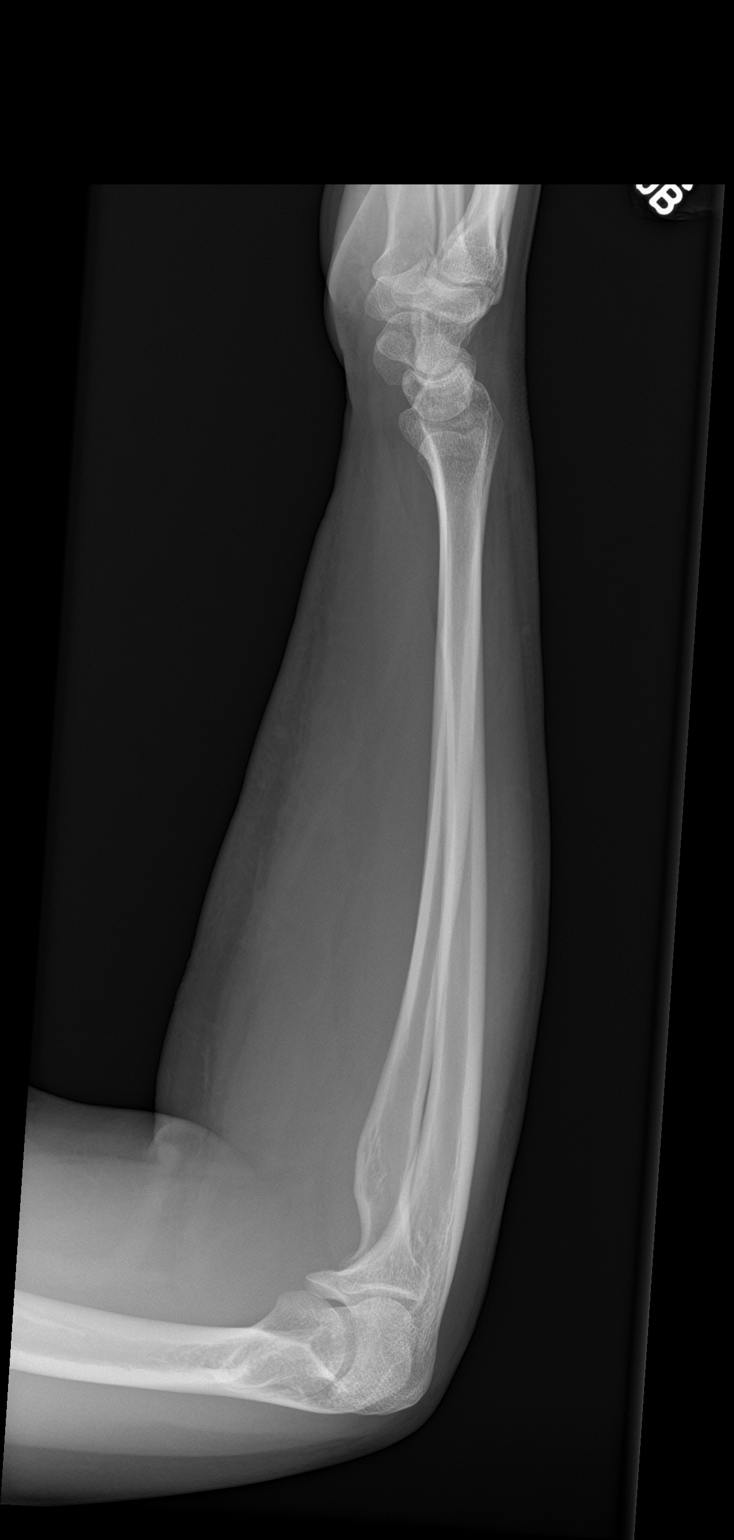

[2 of 2 positions shown; findings below may reference images not displayed]

FINDINGS: There is no evidence of fracture or other focal bone lesions. Soft
tissues are unremarkable. No evidence for a radiopaque foreign body.
IMPRESSION: Negative.

## 2023-02-02 ENCOUNTER — Other Ambulatory Visit: Payer: Self-pay

## 2023-02-02 ENCOUNTER — Ambulatory Visit
Admission: EM | Admit: 2023-02-02 | Discharge: 2023-02-02 | Disposition: A | Discharge status: Home / Self Care | Payer: 59 | Attending: Family Medicine | Admitting: Family Medicine

## 2023-02-02 ENCOUNTER — Ambulatory Visit: Payer: 59

## 2023-02-02 DIAGNOSIS — R9389 Abnormal findings on diagnostic imaging of other specified body structures: Secondary | ICD-10-CM | POA: Diagnosis not present

## 2023-02-02 DIAGNOSIS — R051 Acute cough: Secondary | ICD-10-CM

## 2023-02-02 DIAGNOSIS — R911 Solitary pulmonary nodule: Secondary | ICD-10-CM

## 2023-02-02 MED ORDER — DOXYCYCLINE HYCLATE 100 MG PO CAPS: 100.0000 mg | ORAL_CAPSULE | Freq: Two times a day (BID) | ORAL | 0 refills | Status: DC

## 2023-02-02 MED ORDER — PREDNISONE 20 MG PO TABS: 40.0000 mg | ORAL_TABLET | Freq: Every day | ORAL | 0 refills | Status: DC

## 2023-02-02 NOTE — Discharge Instructions (Signed)
Take doxycycline 2 times a day.  Take the doxycycline with food May use over-the-counter cough or cold medicines as needed Take the prednisone once a day for 5 days  You need to repeat chest x-ray in 1 month

## 2023-02-02 NOTE — ED Provider Notes (Signed)
[name]    CSN: [id number] Arrival date & time: 02/02/23  1601      History   Chief Complaint Chief Complaint  Patient presents with   Cough    HPI Ellen Romero is a 37 y.o. female.   HPI  Patient started off with a cough and cold.  She states that the cough has gotten worse.  She has bad coughing spells that keep her awake.  She has been coughing for 12 days she has done home testing for flu a flu B and COVID.  They were all negative.  She has been taking over-the-counter medicines with no improvement.  History reviewed. No pertinent past medical history.  There are no active problems to display for this patient.   Past Surgical History:  Procedure Laterality Date   HERNIA REPAIR     IUD REMOVAL      OB History   No obstetric history on file.      Home Medications    Prior to Admission medications   Medication Sig Start Date End Date Taking? Authorizing Provider  doxycycline (VIBRAMYCIN) 100 MG capsule Take 1 capsule (100 mg total) by mouth 2 (two) times daily. 02/02/23  Yes Eustace Moore, MD  predniSONE (DELTASONE) 20 MG tablet Take 2 tablets (40 mg total) by mouth daily with breakfast. 02/02/23  Yes Eustace Moore, MD  fexofenadine (ALLEGRA) 180 MG tablet Take 180 mg by mouth daily.    [provider]    Family History Family History  Problem Relation Age of Onset   Cancer Mother    Hypertension Mother    Atrial fibrillation Father    Diverticulitis Father     Social History Social History   Tobacco Use   Smoking status: Former    Types: Cigars    Quit date: 2019    Years since quitting: 5.9   Smokeless tobacco: Never  Vaping Use   Vaping status: Never Used  Substance Use Topics   Alcohol use: Yes    Comment: occasionally   Drug use: Not Currently     Allergies   Patient has no active allergies.   Review of Systems Review of Systems See HPI  Physical Exam Triage Vital Signs ED Triage  Vitals  Encounter Vitals Group     BP 02/02/23 0846 122/81     Systolic BP Percentile --      Diastolic BP Percentile --      Pulse Rate 02/02/23 0846 60     Resp 02/02/23 0846 16     Temp 02/02/23 0846 98.6 F (37 C)     Temp Source 02/02/23 0846 Oral     SpO2 02/02/23 0846 99 %     Weight --      Height --      Head Circumference --      Peak Flow --      Pain Score 02/02/23 0848 0     Pain Loc --      Pain Education --      Exclude from Growth Chart --    No data found.  Updated Vital Signs BP 122/81   Pulse 60   Temp 98.6 F (37 C) (Oral)   Resp 16   SpO2 99%      Physical Exam Constitutional:      General: She is not in acute distress.    Appearance: She is well-developed.  HENT:     Head: Normocephalic and atraumatic.  Eyes:     Conjunctiva/sclera: Conjunctivae normal.     Pupils: Pupils are equal, round, and reactive to light.  Cardiovascular:     Rate and Rhythm: Normal rate.  Pulmonary:     Effort: Pulmonary effort is normal. No respiratory distress.     Breath sounds: Rhonchi present.  Abdominal:     General: There is no distension.     Palpations: Abdomen is soft.  Musculoskeletal:        General: Normal range of motion.     Cervical back: Normal range of motion.  Skin:    General: Skin is warm and dry.  Neurological:     Mental Status: She is alert.      UC Treatments / Results  Labs (all labs ordered are listed, but only abnormal results are displayed) Labs Reviewed - No data to display  EKG   Radiology DG Chest 2 View Result Date: 02/02/2023 CLINICAL DATA:  Cough for 12 days. EXAM: CHEST - 2 VIEW COMPARISON:  None Available. FINDINGS: No pneumothorax, effusion or edema. Normal cardiopericardial silhouette. There is a nodular area overlying the right midlung, anterior aspect of the right fourth rib. This also areas seen in the lateral view. IMPRESSION: Right midlung nodular focus. Recommend comparison to prior or follow up chest CT  when appropriate to further delineate. This has a differential. Electronically Signed   By: Karen Kays M.D.   On: 02/02/2023 09:41    Procedures Procedures (including critical care time)  Medications Ordered in UC Medications - No data to display  Initial Impression / Assessment and Plan / UC Course  I have reviewed the triage vital signs and the nursing notes.  Pertinent labs & imaging results that were available during my care of the patient were reviewed by me and considered in my medical decision making (see chart for details).     I showed the patient her x-ray.  There is a small nodular density.  I recommend a course of antibiotics and follow-up chest x-ray.  Patient states she does have a PCP she can go to for the follow-up x-ray.  Importance of follow-up is emphasized to patient Final Clinical Impressions(s) / UC Diagnoses   Final diagnoses:  Acute cough  Nodular radiologic density     Discharge Instructions      Take doxycycline 2 times a day.  Take the doxycycline with food May use over-the-counter cough or cold medicines as needed Take the prednisone once a day for 5 days  You need to repeat chest x-ray in 1 month     ED Prescriptions     Medication Sig Dispense Auth. Provider   doxycycline (VIBRAMYCIN) 100 MG capsule Take 1 capsule (100 mg total) by mouth 2 (two) times daily. 20 capsule Eustace Moore, MD   predniSONE (DELTASONE) 20 MG tablet Take 2 tablets (40 mg total) by mouth daily with breakfast. 10 tablet Eustace Moore, MD      PDMP not reviewed this encounter.   Eustace Moore, MD 02/02/23 1037

## 2023-02-02 NOTE — ED Triage Notes (Signed)
Sick 12 days, started with sore throat. Cough, congestion has lingered. No fever. Has tested for flu a, b and covid which were all negative. Has taken mucinex, robitussin, tylenol.

## 2023-04-04 ENCOUNTER — Ambulatory Visit (INDEPENDENT_AMBULATORY_CARE_PROVIDER_SITE_OTHER): Payer: 59

## 2023-04-04 ENCOUNTER — Ambulatory Visit
Admission: EM | Admit: 2023-04-04 | Discharge: 2023-04-04 | Disposition: A | Discharge status: Home / Self Care | Payer: 59 | Attending: Emergency Medicine | Admitting: Emergency Medicine

## 2023-04-04 ENCOUNTER — Other Ambulatory Visit: Payer: Self-pay

## 2023-04-04 DIAGNOSIS — R059 Cough, unspecified: Secondary | ICD-10-CM

## 2023-04-04 DIAGNOSIS — R051 Acute cough: Secondary | ICD-10-CM

## 2023-04-04 HISTORY — DX: Pneumonia, unspecified organism: J18.9

## 2023-04-04 MED ORDER — PREDNISONE 10 MG PO TABS: 40.0000 mg | ORAL_TABLET | Freq: Every day | ORAL | 0 refills | Status: AC

## 2023-04-04 MED ORDER — PROMETHAZINE-DM 6.25-15 MG/5ML PO SYRP: 5.0000 mL | ORAL_SOLUTION | Freq: Four times a day (QID) | ORAL | 0 refills | Status: AC | PRN

## 2023-04-04 NOTE — Discharge Instructions (Signed)
 The official radiology over read of your chest x-ray is pending, if the interpretation is different than mine I will contact you via telephone and update you.  Start taking the prednisone daily with breakfast.  You can use the cough medicine up to 4 times daily as needed, do not drink alcohol or drive on this medication as it may cause drowsiness.  Postviral cough sometimes can last for up to 6 weeks.  Follow-up with primary care return to clinic if you have any new or concerning symptoms.

## 2023-04-04 NOTE — ED Triage Notes (Signed)
 Coughing x 3 weeks. No fever. Oldest daughter had flu 3 weeks ago, thinks she may have had it. Has been taking mucinex. Cough worse at night. Had pna in December.

## 2023-04-04 NOTE — ED Provider Notes (Signed)
 [name]    CSN: [id number] Arrival date & time: 04/04/23  1036      History   Chief Complaint Chief Complaint  Patient presents with   Cough    HPI Ellen Romero is a 38 y.o. female.   Patient presents to clinic over concerns of an ongoing cough for the past 3 weeks.  Cough is productive with a yellow sputum.  Oldest daughter did have the flu 3 weeks ago, unsure if she also got the sickness.  Her youngest tested positive for the flu this past Friday.  For the cough she has been taking over-the-counter Robitussin and Mucinex.  Cough is worse at night.  She is concerned because she did have pneumonia in December.  Has not had any fevers.  Feels some upper airway wheezing, no shortness of breath.  No fatigue.  The history is provided by the patient and medical records.  Cough   Past Medical History:  Diagnosis Date   Pneumonia     There are no active problems to display for this patient.   Past Surgical History:  Procedure Laterality Date   HERNIA REPAIR     IUD REMOVAL      OB History   No obstetric history on file.      Home Medications    Prior to Admission medications   Medication Sig Start Date End Date Taking? Authorizing Provider  predniSONE (DELTASONE) 10 MG tablet Take 4 tablets (40 mg total) by mouth daily with breakfast for 5 days. 04/04/23 04/09/23 Yes Rinaldo Ratel, [place], FNP  promethazine-dextromethorphan (PROMETHAZINE-DM) 6.25-15 MG/5ML syrup Take 5 mLs by mouth 4 (four) times daily as needed for cough. 04/04/23  Yes Rinaldo Ratel, [place], FNP  fexofenadine (ALLEGRA) 180 MG tablet Take 180 mg by mouth daily.    [provider]    Family History Family History  Problem Relation Age of Onset   Cancer Mother    Hypertension Mother    Atrial fibrillation Father    Diverticulitis Father     Social History Social History   Tobacco Use   Smoking status: Former    Types: Cigars    Quit date: 2019    Years since  quitting: 6.1   Smokeless tobacco: Never  Vaping Use   Vaping status: Never Used  Substance Use Topics   Alcohol use: Yes    Comment: occasionally   Drug use: Not Currently     Allergies   Amoxicillin   Review of Systems Review of Systems  Per HPI   Physical Exam Triage Vital Signs ED Triage Vitals  Encounter Vitals Group     BP 04/04/23 1051 117/71     Systolic BP Percentile --      Diastolic BP Percentile --      Pulse Rate 04/04/23 1051 81     Resp 04/04/23 1051 16     Temp 04/04/23 1051 97.8 F (36.6 C)     Temp src --      SpO2 04/04/23 1051 97 %     Weight --      Height --      Head Circumference --      Peak Flow --      Pain Score 04/04/23 1055 2     Pain Loc --      Pain Education --      Exclude from Growth Chart --    No data found.  Updated Vital Signs BP 117/71   Pulse  81   Temp 97.8 F (36.6 C)   Resp 16   LMP 03/27/2023 (Approximate)   SpO2 97%   Visual Acuity Right Eye Distance:   Left Eye Distance:   Bilateral Distance:    Right Eye Near:   Left Eye Near:    Bilateral Near:     Physical Exam Vitals and nursing note reviewed.  Constitutional:      Appearance: Normal appearance.  HENT:     Head: Normocephalic and atraumatic.     Right Ear: External ear normal.     Left Ear: External ear normal.     Nose: Nose normal.     Mouth/Throat:     Mouth: Mucous membranes are moist.  Eyes:     Conjunctiva/sclera: Conjunctivae normal.  Cardiovascular:     Rate and Rhythm: Normal rate and regular rhythm.     Heart sounds: Normal heart sounds. No murmur heard. Pulmonary:     Effort: Pulmonary effort is normal.     Breath sounds: Wheezing present.  Musculoskeletal:        General: Normal range of motion.  Skin:    General: Skin is warm and dry.  Neurological:     General: No focal deficit present.     Mental Status: She is alert and oriented to person, place, and time.  Psychiatric:        Mood and Affect: Mood normal.         Behavior: Behavior normal.      UC Treatments / Results  Labs (all labs ordered are listed, but only abnormal results are displayed) Labs Reviewed - No data to display  EKG   Radiology DG Chest 2 View Result Date: 04/04/2023 CLINICAL DATA:  Cough. EXAM: CHEST - 2 VIEW COMPARISON:  History recommended 02/02/2023. FINDINGS: The heart size and mediastinal contours are within normal limits. Both lungs are clear. The visualized skeletal structures are unremarkable. IMPRESSION: No active cardiopulmonary disease. Electronically Signed   By: Elgie Collard M.D.   On: 04/04/2023 12:31    Procedures Procedures (including critical care time)  Medications Ordered in UC Medications - No data to display  Initial Impression / Assessment and Plan / UC Course  I have reviewed the triage vital signs and the nursing notes.  Pertinent labs & imaging results that were available during my care of the patient were reviewed by me and considered in my medical decision making (see chart for details).  Vitals and triage reviewed, patient is hemodynamically stable.  Expiratory wheezing in upper lobes, heart with regular rate and rhythm.  Chest x-ray obtained due to prolonged duration of cough, chest x-ray per my interpretation shows resolution of the right sided pulmonary nodule and no acute opacities, does not appear to have pneumonia at this time.  Will contact via telephone if radiology interpretation is different than mine.  Will give prednisone for wheezing, symptomatic management encouraged.  Plan of care, follow-up care return precautions given, no questions at this time.  Official radiology overread shows no acute cardiopulmonary disease.  No change to treatment plan.     Final Clinical Impressions(s) / UC Diagnoses   Final diagnoses:  Acute cough     Discharge Instructions      The official radiology over read of your chest x-ray is pending, if the interpretation is different than mine I  will contact you via telephone and update you.  Start taking the prednisone daily with breakfast.  You can use the cough medicine up to  4 times daily as needed, do not drink alcohol or drive on this medication as it may cause drowsiness.  Postviral cough sometimes can last for up to 6 weeks.  Follow-up with primary care return to clinic if you have any new or concerning symptoms.     ED Prescriptions     Medication Sig Dispense Auth. Provider   predniSONE (DELTASONE) 10 MG tablet Take 4 tablets (40 mg total) by mouth daily with breakfast for 5 days. 20 tablet Rinaldo Ratel, [place] N, [place]   promethazine-dextromethorphan (PROMETHAZINE-DM) 6.25-15 MG/5ML syrup Take 5 mLs by mouth 4 (four) times daily as needed for cough. 118 mL Marlowe Lawes, [place] N, [place]      PDMP not reviewed this encounter.   Marquesa Rath, [place] N, [place] 04/04/23 1235
